# Patient Record
Sex: Female | Born: 1960 | Race: White | Hispanic: No | Marital: Married | Smoking: Never smoker
Health system: Southern US, Community
[De-identification: ages and names within clinical notes are randomized; demographics above are authoritative.]

## PROBLEM LIST (undated history)

## (undated) DIAGNOSIS — H8109 Meniere's disease, unspecified ear: Secondary | ICD-10-CM

## (undated) DIAGNOSIS — E7800 Pure hypercholesterolemia, unspecified: Secondary | ICD-10-CM

## (undated) DIAGNOSIS — G250 Essential tremor: Secondary | ICD-10-CM

## (undated) DIAGNOSIS — M797 Fibromyalgia: Secondary | ICD-10-CM

## (undated) HISTORY — PX: TYMPANOSTOMY TUBE PLACEMENT: SHX32

## (undated) HISTORY — PX: TONSILLECTOMY: SUR1361

---

## 2008-05-25 ENCOUNTER — Emergency Department (HOSPITAL_BASED_OUTPATIENT_CLINIC_OR_DEPARTMENT_OTHER): Admission: EM | Admit: 2008-05-25 | Discharge: 2008-05-25 | Payer: Self-pay | Admitting: Emergency Medicine

## 2008-05-26 ENCOUNTER — Inpatient Hospital Stay (HOSPITAL_COMMUNITY): Admission: AD | Admit: 2008-05-26 | Discharge: 2008-05-26 | Payer: Self-pay | Admitting: Obstetrics & Gynecology

## 2008-09-23 ENCOUNTER — Emergency Department (HOSPITAL_BASED_OUTPATIENT_CLINIC_OR_DEPARTMENT_OTHER): Admission: EM | Admit: 2008-09-23 | Discharge: 2008-09-23 | Payer: Self-pay | Admitting: Emergency Medicine

## 2008-09-23 ENCOUNTER — Ambulatory Visit: Payer: Self-pay | Admitting: Diagnostic Radiology

## 2008-09-29 ENCOUNTER — Ambulatory Visit: Payer: Self-pay | Admitting: Radiology

## 2008-09-29 ENCOUNTER — Ambulatory Visit (HOSPITAL_BASED_OUTPATIENT_CLINIC_OR_DEPARTMENT_OTHER): Admission: RE | Admit: 2008-09-29 | Discharge: 2008-09-29 | Payer: Self-pay | Admitting: Emergency Medicine

## 2009-10-01 HISTORY — PX: COLONOSCOPY: SHX174

## 2011-07-06 LAB — BASIC METABOLIC PANEL WITH GFR
CO2: 25 meq/L (ref 19–32)
Calcium: 9.5 mg/dL (ref 8.4–10.5)
Creatinine, Ser: 0.9 mg/dL (ref 0.4–1.2)
GFR calc Af Amer: >60 mL/min (ref >60)
GFR calc non Af Amer: >60 mL/min (ref >60)
Glucose, Bld: 102 mg/dL — ABNORMAL HIGH (ref 70–99)
Sodium: 140 meq/L (ref 135–145)

## 2011-07-06 LAB — URINALYSIS, ROUTINE W REFLEX MICROSCOPIC
Bilirubin Urine: NEGATIVE
Glucose, UA: NEGATIVE mg/dL
Hgb urine dipstick: NEGATIVE
Ketones, ur: NEGATIVE mg/dL
Nitrite: NEGATIVE
Protein, ur: NEGATIVE mg/dL
Specific Gravity, Urine: 1.01 (ref 1.005–1.030)
Urobilinogen, UA: 0.2 mg/dL (ref 0.0–1.0)
pH: 5 (ref 5.0–8.0)

## 2011-07-06 LAB — DIFFERENTIAL
Basophils Absolute: 0.1 K/uL (ref 0.0–0.1)
Basophils Relative: 1 % (ref 0–1)
Eosinophils Absolute: 0.2 10*3/uL (ref 0.0–0.7)
Eosinophils Relative: 3 % (ref 0–5)
Lymphocytes Relative: 39 % (ref 12–46)
Lymphs Abs: 2.4 10*3/uL (ref 0.7–4.0)
Monocytes Absolute: 0.5 10*3/uL (ref 0.1–1.0)
Monocytes Relative: 8 % (ref 3–12)
Neutro Abs: 2.9 K/uL (ref 1.7–7.7)
Neutrophils Relative %: 48 % (ref 43–77)

## 2011-07-06 LAB — CBC
HCT: 43.3 % (ref 36.0–46.0)
Hemoglobin: 14.4 g/dL (ref 12.0–15.0)
MCHC: 33.1 g/dL (ref 30.0–36.0)
MCV: 86.5 fL (ref 78.0–100.0)
Platelets: 270 K/uL (ref 150–400)
RBC: 5.01 MIL/uL (ref 3.87–5.11)
RDW: 12.5 % (ref 11.5–15.5)
WBC: 6.1 10*3/uL (ref 4.0–10.5)

## 2011-07-06 LAB — PREGNANCY, URINE: Preg Test, Ur: NEGATIVE

## 2011-07-06 LAB — BASIC METABOLIC PANEL
BUN: 9 mg/dL (ref 6–23)
Chloride: 104 mEq/L (ref 96–112)
Potassium: 4.3 mEq/L (ref 3.5–5.1)

## 2011-07-06 LAB — POCT CARDIAC MARKERS
CKMB, poc: <1 ng/mL — ABNORMAL LOW (ref 1.0–8.0)
Myoglobin, poc: 62 ng/mL (ref 12–200)
Troponin i, poc: <0.05 ng/mL (ref 0.00–0.09)

## 2011-07-06 LAB — GLUCOSE, CAPILLARY: Glucose-Capillary: 113 mg/dL — ABNORMAL HIGH (ref 70–99)

## 2015-05-16 ENCOUNTER — Encounter (HOSPITAL_BASED_OUTPATIENT_CLINIC_OR_DEPARTMENT_OTHER): Payer: Self-pay | Admitting: Emergency Medicine

## 2015-05-16 ENCOUNTER — Emergency Department (HOSPITAL_BASED_OUTPATIENT_CLINIC_OR_DEPARTMENT_OTHER): Payer: Medicare Other

## 2015-05-16 ENCOUNTER — Observation Stay (HOSPITAL_BASED_OUTPATIENT_CLINIC_OR_DEPARTMENT_OTHER)
Admission: EM | Admit: 2015-05-16 | Discharge: 2015-05-17 | Disposition: A | Discharge status: Home / Self Care | Payer: Medicare Other | Attending: Internal Medicine | Admitting: Internal Medicine

## 2015-05-16 DIAGNOSIS — E669 Obesity, unspecified: Secondary | ICD-10-CM | POA: Insufficient documentation

## 2015-05-16 DIAGNOSIS — Z6835 Body mass index (BMI) 35.0-35.9, adult: Secondary | ICD-10-CM | POA: Insufficient documentation

## 2015-05-16 DIAGNOSIS — R0789 Other chest pain: Secondary | ICD-10-CM | POA: Diagnosis not present

## 2015-05-16 DIAGNOSIS — I200 Unstable angina: Secondary | ICD-10-CM | POA: Diagnosis not present

## 2015-05-16 DIAGNOSIS — E7800 Pure hypercholesterolemia, unspecified: Secondary | ICD-10-CM | POA: Diagnosis present

## 2015-05-16 DIAGNOSIS — H8109 Meniere's disease, unspecified ear: Secondary | ICD-10-CM | POA: Diagnosis not present

## 2015-05-16 DIAGNOSIS — Z8249 Family history of ischemic heart disease and other diseases of the circulatory system: Secondary | ICD-10-CM | POA: Insufficient documentation

## 2015-05-16 DIAGNOSIS — E785 Hyperlipidemia, unspecified: Secondary | ICD-10-CM | POA: Insufficient documentation

## 2015-05-16 DIAGNOSIS — R079 Chest pain, unspecified: Secondary | ICD-10-CM | POA: Diagnosis present

## 2015-05-16 DIAGNOSIS — Z7982 Long term (current) use of aspirin: Secondary | ICD-10-CM | POA: Insufficient documentation

## 2015-05-16 DIAGNOSIS — Z79899 Other long term (current) drug therapy: Secondary | ICD-10-CM | POA: Insufficient documentation

## 2015-05-16 DIAGNOSIS — M797 Fibromyalgia: Secondary | ICD-10-CM | POA: Diagnosis present

## 2015-05-16 HISTORY — DX: Fibromyalgia: M79.7

## 2015-05-16 HISTORY — DX: Essential tremor: G25.0

## 2015-05-16 HISTORY — DX: Pure hypercholesterolemia, unspecified: E78.00

## 2015-05-16 HISTORY — DX: Meniere's disease, unspecified ear: H81.09

## 2015-05-16 LAB — BASIC METABOLIC PANEL
ANION GAP: 8 (ref 5–15)
Anion gap: 9 (ref 5–15)
BUN: 11 mg/dL (ref 6–20)
BUN: 10 mg/dL (ref 6–20)
CALCIUM: 8.8 mg/dL — AB (ref 8.9–10.3)
CHLORIDE: 105 mmol/L (ref 101–111)
CO2: 25 mmol/L (ref 22–32)
CO2: 24 mmol/L (ref 22–32)
CREATININE: 0.93 mg/dL (ref 0.44–1.00)
Calcium: 9 mg/dL (ref 8.9–10.3)
Chloride: 107 mmol/L (ref 101–111)
Creatinine, Ser: 0.92 mg/dL (ref 0.44–1.00)
GFR calc Af Amer: >60 mL/min (ref >60)
GFR calc Af Amer: >60 mL/min (ref >60)
GFR calc non Af Amer: >60 mL/min (ref >60)
GLUCOSE: 117 mg/dL — AB (ref 65–99)
Glucose, Bld: 121 mg/dL — ABNORMAL HIGH (ref 65–99)
POTASSIUM: 3.9 mmol/L (ref 3.5–5.1)
Potassium: 3.6 mmol/L (ref 3.5–5.1)
SODIUM: 138 mmol/L (ref 135–145)
Sodium: 140 mmol/L (ref 135–145)

## 2015-05-16 LAB — CBC WITH DIFFERENTIAL/PLATELET
BASOS ABS: 0.1 10*3/uL (ref 0.0–0.1)
Basophils Relative: 1 % (ref 0–1)
EOS PCT: 3 % (ref 0–5)
Eosinophils Absolute: 0.2 10*3/uL (ref 0.0–0.7)
HEMATOCRIT: 41.7 % (ref 36.0–46.0)
Hemoglobin: 13.5 g/dL (ref 12.0–15.0)
LYMPHS ABS: 2.4 10*3/uL (ref 0.7–4.0)
LYMPHS PCT: 49 % — AB (ref 12–46)
MCH: 29.2 pg (ref 26.0–34.0)
MCHC: 32.4 g/dL (ref 30.0–36.0)
MCV: 90.3 fL (ref 78.0–100.0)
MONO ABS: 0.5 10*3/uL (ref 0.1–1.0)
Monocytes Relative: 10 % (ref 3–12)
NEUTROS ABS: 1.9 10*3/uL (ref 1.7–7.7)
Neutrophils Relative %: 37 % — ABNORMAL LOW (ref 43–77)
PLATELETS: 215 10*3/uL (ref 150–400)
RBC: 4.62 MIL/uL (ref 3.87–5.11)
RDW: 13.3 % (ref 11.5–15.5)
WBC: 5 10*3/uL (ref 4.0–10.5)

## 2015-05-16 LAB — HEPATIC FUNCTION PANEL
ALBUMIN: 3.7 g/dL (ref 3.5–5.0)
ALT: 25 U/L (ref 14–54)
AST: 22 U/L (ref 15–41)
Alkaline Phosphatase: 94 U/L (ref 38–126)
Bilirubin, Direct: <0.1 mg/dL — ABNORMAL LOW (ref 0.1–0.5)
TOTAL PROTEIN: 7 g/dL (ref 6.5–8.1)
Total Bilirubin: 0.3 mg/dL (ref 0.3–1.2)

## 2015-05-16 LAB — TSH: TSH: 0.775 u[IU]/mL (ref 0.350–4.500)

## 2015-05-16 LAB — TROPONIN I
Troponin I: <0.03 ng/mL (ref <0.031)
Troponin I: <0.03 ng/mL (ref <0.031)

## 2015-05-16 LAB — LIPASE, BLOOD: LIPASE: 21 U/L — AB (ref 22–51)

## 2015-05-16 LAB — T4, FREE: FREE T4: 0.84 ng/dL (ref 0.61–1.12)

## 2015-05-16 MED ORDER — GI COCKTAIL ~~LOC~~
30.0000 mL | Freq: Four times a day (QID) | ORAL | Status: DC | PRN
  Filled 2015-05-16: qty 30

## 2015-05-16 MED ORDER — SODIUM CHLORIDE 0.9 % IV SOLN: 250.0000 mL | INTRAVENOUS | Status: DC | PRN

## 2015-05-16 MED ORDER — PANTOPRAZOLE SODIUM 40 MG PO TBEC
40.0000 mg | DELAYED_RELEASE_TABLET | Freq: Every day | ORAL | Status: DC
  Administered 2015-05-17: 40 mg via ORAL
  Filled 2015-05-16: qty 1

## 2015-05-16 MED ORDER — DIAZEPAM 5 MG PO TABS
2.5000 mg | ORAL_TABLET | Freq: Two times a day (BID) | ORAL | Status: DC
  Administered 2015-05-17 (×2): 2.5 mg via ORAL
  Filled 2015-05-16 (×2): qty 1

## 2015-05-16 MED ORDER — AMANTADINE HCL 100 MG PO CAPS
100.0000 mg | ORAL_CAPSULE | Freq: Two times a day (BID) | ORAL | Status: DC
  Administered 2015-05-16 – 2015-05-17 (×3): 100 mg via ORAL
  Filled 2015-05-16 (×4): qty 1

## 2015-05-16 MED ORDER — DIAZEPAM 5 MG PO TABS
5.0000 mg | ORAL_TABLET | Freq: Two times a day (BID) | ORAL | Status: DC
  Administered 2015-05-16: 2.5 mg via ORAL
  Filled 2015-05-16: qty 1

## 2015-05-16 MED ORDER — AMANTADINE HCL 100 MG PO CAPS
100.0000 mg | ORAL_CAPSULE | Freq: Two times a day (BID) | ORAL | Status: DC
  Filled 2015-05-16: qty 1

## 2015-05-16 MED ORDER — ONDANSETRON HCL 4 MG/2ML IJ SOLN: 4.0000 mg | Freq: Four times a day (QID) | INTRAMUSCULAR | Status: DC | PRN

## 2015-05-16 MED ORDER — ASPIRIN 81 MG PO TABS: 81.0000 mg | ORAL_TABLET | Freq: Every day | ORAL | Status: DC

## 2015-05-16 MED ORDER — SODIUM CHLORIDE 0.9 % WEIGHT BASED INFUSION
3.0000 mL/kg/h | INTRAVENOUS | Status: DC
  Administered 2015-05-17: 3 mL/kg/h via INTRAVENOUS

## 2015-05-16 MED ORDER — ACETAMINOPHEN 325 MG PO TABS: 650.0000 mg | ORAL_TABLET | ORAL | Status: DC | PRN

## 2015-05-16 MED ORDER — DIAZEPAM 5 MG PO TABS: 5.0000 mg | ORAL_TABLET | Freq: Two times a day (BID) | ORAL | Status: DC

## 2015-05-16 MED ORDER — ENOXAPARIN SODIUM 40 MG/0.4ML ~~LOC~~ SOLN
40.0000 mg | SUBCUTANEOUS | Status: DC
  Administered 2015-05-16: 40 mg via SUBCUTANEOUS
  Filled 2015-05-16 (×2): qty 0.4

## 2015-05-16 MED ORDER — SODIUM CHLORIDE 0.9 % WEIGHT BASED INFUSION: 1.0000 mL/kg/h | INTRAVENOUS | Status: DC

## 2015-05-16 MED ORDER — PROMETHAZINE HCL 25 MG PO TABS: 25.0000 mg | ORAL_TABLET | Freq: Four times a day (QID) | ORAL | Status: DC | PRN

## 2015-05-16 MED ORDER — ASPIRIN 81 MG PO CHEW
81.0000 mg | CHEWABLE_TABLET | ORAL | Status: AC
  Administered 2015-05-17: 81 mg via ORAL
  Filled 2015-05-16: qty 1

## 2015-05-16 MED ORDER — SODIUM CHLORIDE 0.9 % IJ SOLN
3.0000 mL | INTRAMUSCULAR | Status: DC | PRN
Start: 2015-05-16 — End: 2015-05-17
  Administered 2015-05-17: 3 mL via INTRAVENOUS
  Filled 2015-05-16: qty 3

## 2015-05-16 MED ORDER — ROSUVASTATIN CALCIUM 5 MG PO TABS
5.0000 mg | ORAL_TABLET | Freq: Every day | ORAL | Status: DC
  Administered 2015-05-16 – 2015-05-17 (×2): 5 mg via ORAL
  Filled 2015-05-16 (×2): qty 1

## 2015-05-16 MED ORDER — NITROGLYCERIN 0.4 MG SL SUBL: 0.4000 mg | SUBLINGUAL_TABLET | SUBLINGUAL | Status: DC | PRN

## 2015-05-16 MED ORDER — VITAMIN B-12 1000 MCG PO TABS
1000.0000 ug | ORAL_TABLET | Freq: Every day | ORAL | Status: DC
  Administered 2015-05-17: 1000 ug via ORAL
  Filled 2015-05-16 (×2): qty 1

## 2015-05-16 MED ORDER — SODIUM CHLORIDE 0.9 % IJ SOLN
3.0000 mL | Freq: Two times a day (BID) | INTRAMUSCULAR | Status: DC
  Administered 2015-05-17: 3 mL via INTRAVENOUS

## 2015-05-16 MED ORDER — ASPIRIN EC 81 MG PO TBEC
81.0000 mg | DELAYED_RELEASE_TABLET | Freq: Every day | ORAL | Status: DC
  Filled 2015-05-16: qty 1

## 2015-05-16 MED ORDER — MORPHINE SULFATE (PF) 2 MG/ML IV SOLN: 2.0000 mg | INTRAVENOUS | Status: DC | PRN

## 2015-05-16 NOTE — H&P (Signed)
[facility] History and Physical                                                                                    Beverly Hernandez, is a 54 y.o. female  MRN: [id number]   DOB - 1961/03/29  Admit Date - 05/16/2015  Outpatient Primary MD for the patient is [facility]. She was transferred from [facility]. Cardiologist:  Armando Gang at [facility]  Referring Physician:  Dr. Doug Sou  Chief Complaint:   Chief Complaint  Patient presents with  . Chest Pain     HPI  Beverly Hernandez  is a 54 y.o. female, with Mnire's disease, fibromyalgia, and hyperlipidemia who presented to the [facility] [place] with chest pain the patient reports that approximately 4:20 AM this morning she awoke from sleep with a heavy burning sensation in the center of her chest. It radiated to her back. It did not radiate to her jaw or down her arms. She did not experience diaphoresis or vomiting. She normally experiences nausea each morning when she wakes to take her medications for Mnire's disease. Her nausea was no worse than usual this morning. She has had a similar sensation before but nothing so intense. She said this was a very heavy pressure on her chest that lasted about 15 minutes and subsided with nitroglycerin. The patient sought medical attention as she has a very strong family history. Both her mother and father had their first heart attacks in their late 71s. She reports having a stress test approximately 3 years ago. There was some positive finding, but she has no cardiac stent or cardiac medications other than a 21 mg aspirin. We will need to obtain records from her cardiologist.  The patient reports that she has had acid reflux in the past and it did not feel anything like this. She still has her gallbladder. She does not drink alcohol or use tobacco or recreational drugs. She did not eat dinner last night. The last thing she ate was [employer] chicken at approximately 1 PM  yesterday afternoon.  Review of Systems   In addition to the HPI above, she denies any headache, sore throat, recent illness, congestion, decreased exercise tolerance, orthopnea, PND, abdominal pain, anorexia, vomiting, changes in bowel habits, dysuria. No Fever-chills, No Headache, No changes with Vision or hearing, No problems swallowing food or Liquids, + Bowel movements are never regular as she experiences both diarrhea and constipation No Blood in stool or Urine, No dysuria, No new skin rashes or bruises, No new joints pains-aches,  No new weakness, tingling, numbness in any extremity, No recent weight gain or loss, A full 10 point Review of Systems was done, except as stated above, all other Review of Systems were negative.  Past Medical History  Past Medical History  Diagnosis Date  . Benign essential tremor   . Meniere's disease   . Fibromyalgia   . Hypercholesterolemia     Past Surgical History  Procedure Laterality Date  . Tonsillectomy    . Tympanostomy tube placement      Social History Social History  Substance Use Topics  . Smoking status: Never  Smoker   . Smokeless tobacco: Not on file  . Alcohol Use: No   lives at home with her husband. Is independent with ADLs.  Family History Father has a pacemaker history of CVA and history of heart attack in his late 52s. Mother also has a history of having her first heart attack by her late 36s.  Prior to Admission medications   Medication Sig Start Date End Date Taking? Authorizing Provider  amantadine (SYMMETREL) 100 MG capsule Take 100 mg by mouth 2 (two) times daily.   Yes Historical Provider, MD  aspirin 81 MG tablet Take 81 mg by mouth daily.   Yes Historical Provider, MD  diazepam (VALIUM) 5 MG tablet Take 5 mg by mouth 2 (two) times daily.   Yes Historical Provider, MD  nitroGLYCERIN (NITROSTAT) 0.4 MG SL tablet Place 0.4 mg under the tongue every 5 (five) minutes as needed for chest pain.   Yes Historical  Provider, MD  promethazine (PHENERGAN) 25 MG tablet Take 25 mg by mouth every 6 (six) hours as needed for nausea or vomiting.   Yes Historical Provider, MD  rosuvastatin (CRESTOR) 5 MG tablet Take 5 mg by mouth daily.   Yes Historical Provider, MD  vitamin B-12 (CYANOCOBALAMIN) 1000 MCG tablet Take 1,000 mcg by mouth daily.   Yes Historical Provider, MD    Allergies  Allergen Reactions  . Codeine   . Sulfa Antibiotics     Physical Exam  Vitals  Blood pressure 112/82, pulse 79, temperature 97.8 F (36.6 C), temperature source Oral, resp. rate 19, height 5\' 7"  (1.702 m), weight 102.059 kg (225 lb), SpO2 98 %.   General:  Obese, pleasant, Caucasian female lying in bed in NAD, has not bedside  Psych:  Normal affect and insight, Not Suicidal or Homicidal, Awake Alert, Oriented X 3.  Neuro:   No F.N deficits, ALL C.Nerves Intact, Strength 5/5 all 4 extremities, Sensation intact all 4 extremities.  ENT:  Ears and Eyes appear Normal, Conjunctivae clear, PER. Moist oral mucosa without erythema or exudates.  Neck:  Supple, No lymphadenopathy appreciated  Respiratory:  Symmetrical chest wall movement, Good air movement bilaterally, CTAB.  Cardiac:  RRR, No Murmurs,  no JVD.    Abdomen:  Positive bowel sounds, Soft, Non tender, Non distended,  No masses appreciated  Skin:  No Cyanosis, Normal Skin Turgor, No Skin Rash or Bruise.  Extremities:  Able to move all 4. 5/5 strength in each,  no effusions. She has 1-2+ bilateral lower extremity edema. She states this is chronic  Data Review  CBC  Recent Labs Lab 05/16/15 0537  WBC 5.0  HGB 13.5  HCT 41.7  PLT 215  MCV 90.3  MCH 29.2  MCHC 32.4  RDW 13.3  LYMPHSABS 2.4  MONOABS 0.5  EOSABS 0.2  BASOSABS 0.1    Chemistries   Recent Labs Lab 05/16/15 0537  NA 140  K 3.9  CL 107  CO2 25  GLUCOSE 117*  BUN 11  CREATININE 0.92  CALCIUM 9.0    Cardiac Enzymes  Recent Labs Lab 05/16/15 0537 05/16/15 0825  05/16/15 1140  TROPONINI <0.03 <0.03 <0.03    Invalid input(s): POCBNP  Urinalysis    Component Value Date/Time   COLORURINE YELLOW 09/23/2008 1221   APPEARANCEUR CLEAR 09/23/2008 1221   LABSPEC 1.010 09/23/2008 1221   PHURINE 5.0 09/23/2008 1221   GLUCOSEU NEGATIVE 09/23/2008 1221   HGBUR NEGATIVE 09/23/2008 1221   BILIRUBINUR NEGATIVE 09/23/2008 1221   KETONESUR NEGATIVE 09/23/2008  1221   PROTEINUR NEGATIVE 09/23/2008 1221   UROBILINOGEN 0.2 09/23/2008 1221   NITRITE NEGATIVE 09/23/2008 1221   LEUKOCYTESUR  09/23/2008 1221    NEGATIVE MICROSCOPIC NOT DONE ON URINES WITH NEGATIVE PROTEIN, BLOOD, LEUKOCYTES, NITRITE, OR GLUCOSE <1000 mg/dL.    Imaging results:   Dg Chest Port 1 View  05/16/2015   CLINICAL DATA:  Chest pain radiating to the back  EXAM: PORTABLE CHEST - 1 VIEW  COMPARISON:  09/23/2008  FINDINGS: Normal heart size and mediastinal contours. No acute infiltrate or edema. No effusion or pneumothorax. No acute osseous findings.  IMPRESSION: Negative portable chest.   Electronically Signed   By: Marnee Spring M.D.   On: 05/16/2015 06:23    My personal review of EKG: NSR, No ST changes noted.   Assessment & Plan  Active Problems:   Chest pain   Chest pressure   Fibromyalgia   Meniere's disease   Hypercholesterolemia     Atypical chest pain A burning pressure in her central chest that lasted 15 minutes. EKG is normal, chest x-ray is normal, troponin is normal. Chest pain is not reproducible on palpation or upper extremity exertion. I will place her on PPI therapy, check a lipase, LFTs and a TSH.  If her lipase or LFTs are elevated may consider an ultrasound. Admitted to telemetry for chest pain rule out. Anticipate she will discharge home tomorrow pending a negative evaluation. Patient takes an 81 mg aspirin daily.  Mnire's disease  Continue symptomatic treatment with Valium and Phenergan as she takes at home  Hyperlipidemia Continue  Crestor   Consultants Called:  None  Family Communication:   Husband bedside  Code Status:  Full code  Condition:  Stable  Potential Disposition: Anticipate discharge to home on 8/16 if workup is negative.  Time spent in minutes : [address]  PA-C on 05/16/2015 at 2:42 PM Between 7am to 7pm - Pager - [phone] After 7pm go to [email or web address] - password [email or web address] And look for the night coverage person covering me after hours  [facility]

## 2015-05-16 NOTE — ED Provider Notes (Signed)
CSN: [id number]     Arrival date & time 05/16/15  0508 History   First MD Initiated Contact with Patient 05/16/15 [phone]     Chief Complaint  Patient presents with  . Chest Pain     (Consider location/radiation/quality/duration/timing/severity/associated sxs/prior Treatment) HPI  This is a 54 year old female who awakened from sleep about 1 hour prior to arrival with chest pain. The chest pain was located in the middle of her chest and radiated to her back. She describes it as feeling like heartburn but more severe. She has never had a pain like this before. She took one sublingual nitroglycerin with relief of pain. She states she was prescribed a nitroglycerin 2 years ago because of a family history of heart disease. She is not known to have coronary artery disease herself. The nitroglycerin is noted to be expired.  The chest pain was associated by equivocal shortness of breath but no diaphoresis. She has chronic nausea due to her Mnire's which was not worse than usual this morning. She is pain-free at the present time.  Past Medical History  Diagnosis Date  . Benign essential tremor   . Meniere's disease   . Fibromyalgia    Past Surgical History  Procedure Laterality Date  . Tonsillectomy    . Tympanostomy tube placement     No family history on file. Social History  Substance Use Topics  . Smoking status: Never Smoker   . Smokeless tobacco: Not on file  . Alcohol Use: No   OB History    No data available     Review of Systems  All other systems reviewed and are negative.   Allergies  Codeine and Sulfa antibiotics  Home Medications   Prior to Admission medications   Medication Sig Start Date End Date Taking? Authorizing Provider  amantadine (SYMMETREL) 100 MG capsule Take 100 mg by mouth 2 (two) times daily.   Yes Historical Provider, MD  aspirin 81 MG tablet Take 81 mg by mouth daily.   Yes Historical Provider, MD  diazepam (VALIUM) 5 MG tablet Take 5 mg by mouth 2  (two) times daily.   Yes Historical Provider, MD  nitroGLYCERIN (NITROSTAT) 0.4 MG SL tablet Place 0.4 mg under the tongue every 5 (five) minutes as needed for chest pain.   Yes Historical Provider, MD  promethazine (PHENERGAN) 25 MG tablet Take 25 mg by mouth every 6 (six) hours as needed for nausea or vomiting.   Yes Historical Provider, MD  rosuvastatin (CRESTOR) 5 MG tablet Take 5 mg by mouth daily.   Yes Historical Provider, MD   BP 119/74 mmHg  Pulse 79  Temp(Src) 98.6 F (37 C) (Oral)  Resp 20  Ht 5\' 7"  (1.702 m)  Wt 225 lb (102.059 kg)  BMI 35.23 kg/m2  SpO2 100%   Physical Exam  General: Well-developed, well-nourished female in no acute distress; appearance consistent with age of record HENT: normocephalic; atraumatic Eyes: pupils equal, round and reactive to light; extraocular muscles intact Neck: supple Heart: regular rate and rhythm; no murmurs, rubs or gallops Lungs: clear to auscultation bilaterally Abdomen: soft; nondistended; nontender; no masses or hepatosplenomegaly; bowel sounds present Extremities: No deformity; full range of motion; pulses normal Neurologic: Awake, alert and oriented; motor function intact in all extremities and symmetric; no facial droop Skin: Warm and dry Psychiatric: Normal mood and affect    ED Course  Procedures (including critical care time)   EKG Interpretation   Date/Time:  Monday May 16 2015 05:17:36 EDT  Ventricular Rate:  74 PR Interval:  152 QRS Duration: 82 QT Interval:  382 QTC Calculation: 424 R Axis:   41 Text Interpretation:  Normal sinus rhythm Normal ECG No significant change  was found Confirmed by Read Drivers  MD, Jonny Ruiz ([id number]) on 05/16/2015 5:18:39 AM      MDM  Nursing notes and vitals signs, including pulse oximetry, reviewed.  Summary of this visit's results, reviewed by myself:  Labs:  Results for orders placed or performed during the hospital encounter of 05/16/15 (from the past 24 hour(s))  CBC with  Differential/Platelet     Status: Abnormal   Collection Time: 05/16/15  5:37 AM  Result Value Ref Range   WBC 5.0 4.0 - 10.5 K/uL   RBC 4.62 3.87 - 5.11 MIL/uL   Hemoglobin 13.5 12.0 - 15.0 g/dL   HCT 60.4 54.0 - 98.1 %   MCV 90.3 78.0 - 100.0 fL   MCH 29.2 26.0 - 34.0 pg   MCHC 32.4 30.0 - 36.0 g/dL   RDW 19.1 47.8 - 29.5 %   Platelets 215 150 - 400 K/uL   Neutrophils Relative % 37 (L) 43 - 77 %   Neutro Abs 1.9 1.7 - 7.7 K/uL   Lymphocytes Relative 49 (H) 12 - 46 %   Lymphs Abs 2.4 0.7 - 4.0 K/uL   Monocytes Relative 10 3 - 12 %   Monocytes Absolute 0.5 0.1 - 1.0 K/uL   Eosinophils Relative 3 0 - 5 %   Eosinophils Absolute 0.2 0.0 - 0.7 K/uL   Basophils Relative 1 0 - 1 %   Basophils Absolute 0.1 0.0 - 0.1 K/uL  Basic metabolic panel     Status: Abnormal   Collection Time: 05/16/15  5:37 AM  Result Value Ref Range   Sodium 140 135 - 145 mmol/L   Potassium 3.9 3.5 - 5.1 mmol/L   Chloride 107 101 - 111 mmol/L   CO2 25 22 - 32 mmol/L   Glucose, Bld 117 (H) 65 - 99 mg/dL   BUN 11 6 - 20 mg/dL   Creatinine, Ser 6.21 0.44 - 1.00 mg/dL   Calcium 9.0 8.9 - 30.8 mg/dL   GFR calc non Af Amer >60 >60 mL/min   GFR calc Af Amer >60 >60 mL/min   Anion gap 8 5 - 15  Troponin I     Status: None   Collection Time: 05/16/15  5:37 AM  Result Value Ref Range   Troponin I <0.03 <0.031 ng/mL    Imaging Studies: Dg Chest Port 1 View  05/16/2015   CLINICAL DATA:  Chest pain radiating to the back  EXAM: PORTABLE CHEST - 1 VIEW  COMPARISON:  09/23/2008  FINDINGS: Normal heart size and mediastinal contours. No acute infiltrate or edema. No effusion or pneumothorax. No acute osseous findings.  IMPRESSION: Negative portable chest.   Electronically Signed   By: Marnee Spring M.D.   On: 05/16/2015 06:23   6:29 AM Patient remains pain-free. We'll have her admitted for observation.    Paula Libra, MD 05/16/15 [phone]

## 2015-05-16 NOTE — Progress Notes (Signed)
Patient is a 54 year old female family history of coronary artery disease presented to the ED with midsternal chest pain radiating to the back that awoke her from sleep relieved with sublingual nitroglycerin. EKG normal. First set of troponin normal. Patient's physicians are at [facility] however hyper original hospital with no beds and patient requested admission to [facility]. Patient accepted to telemetry for chest pain rule out.

## 2015-05-16 NOTE — Consult Note (Signed)
Patient ID: Beverly Hernandez MRN: [id number], DOB/AGE: [birth year]   Admit date: 05/16/2015   Primary Physician: Arnette Felts, PA-C [facility]) Primary Cardiologist: New  Pt. Profile:  54 year old female with no known history of coronary disease but with multiple risk factors including hyperlipidemia and strong family history of premature CAD in multiple first-degree relatives, presenting with complaints of nocturnal chest pain awakening her from her sleep.  Problem List  Past Medical History  Diagnosis Date  . Benign essential tremor   . Meniere's disease   . Fibromyalgia   . Hypercholesterolemia     Past Surgical History  Procedure Laterality Date  . Tonsillectomy    . Tympanostomy tube placement       Allergies  Allergies  Allergen Reactions  . Codeine   . Sulfa Antibiotics     HPI  54 y/o female with no past cardiac history but multiple risk factors including strong family history of premature CAD in fist degree relatives and HLD, presenting with complaint of chest pain. She notes both her mother and father suffered myocardial infarctions while in their 30s. Her older brother also suffered myocardial infarction/cardiac arrest while in his 98s. She is on statin therapy for her HLD, which is followed by her PCP. She denies any personal history of diabetes, hypertension or tobacco abuse. She reports that she did undergo nuclear stress testing at [facility] 5 years that was "ok". However she was told that she had a thickened heart. She believes this was discovered by an echocardiogram. She reports that her PCP wanted her to undergo further testing however she failed to follow-up at that time. She has never had a heart catheterization. Her other medical problems include Mnire's disease and fibromyalgia.  She reports that she was in her usual state of health until the early morning hours. She was awoken from her sleep at 4 AM with substernal chest  pressure/burning. The discomfort radiated to her back. She notes mild dyspnea but denies any diaphoresis, nausea, vomiting. She reports that she took a subungual nitroglycerin that she had at home and had immediate relief. She denies any further recurrence since her initial episode this morning. She denies any prior history of acid reflux. She states that her last meal was 1 PM yesterday afternoon. Given the nature of her symptoms and strong family history, she decided to go to an urgent care earlier today. She was then referred to the emergency department for further evaluation. She was admitted by internal medicine. As mentioned above, she has had no further chest pain. Cardiac enzymes are negative x 3. CXR is unremarkable. EKG shows NSR w/o ischemia.   Home Medications  Prior to Admission medications   Medication Sig Start Date End Date Taking? Authorizing Provider  amantadine (SYMMETREL) 100 MG capsule Take 100 mg by mouth 2 (two) times daily.   Yes Historical Provider, MD  aspirin 81 MG tablet Take 81 mg by mouth daily.   Yes Historical Provider, MD  diazepam (VALIUM) 5 MG tablet Take 5 mg by mouth 2 (two) times daily.   Yes Historical Provider, MD  nitroGLYCERIN (NITROSTAT) 0.4 MG SL tablet Place 0.4 mg under the tongue every 5 (five) minutes as needed for chest pain.   Yes Historical Provider, MD  promethazine (PHENERGAN) 25 MG tablet Take 25 mg by mouth every 6 (six) hours as needed for nausea or vomiting.   Yes Historical Provider, MD  rosuvastatin (CRESTOR) 5 MG tablet Take 5 mg by mouth daily.  Yes Historical Provider, MD  vitamin B-12 (CYANOCOBALAMIN) 1000 MCG tablet Take 1,000 mcg by mouth daily.   Yes Historical Provider, MD    Family History  Family History  Problem Relation Age of Onset  . Coronary artery disease Mother 89    MI  . Coronary artery disease Father 30    MI  . Coronary artery disease Brother 50    MI  . Sudden Cardiac Death Brother 1    Social  History  Social History   Social History  . Marital Status: Married    Spouse Name: N/A  . Number of Children: N/A  . Years of Education: N/A   Occupational History  . Not on file.   Social History Main Topics  . Smoking status: Never Smoker   . Smokeless tobacco: Not on file  . Alcohol Use: No  . Drug Use: No  . Sexual Activity: Not on file   Other Topics Concern  . Not on file   Social History Narrative  . No narrative on file     Review of Systems General:  No chills, fever, night sweats or weight changes.  Cardiovascular:  No chest pain, dyspnea on exertion, edema, orthopnea, palpitations, paroxysmal nocturnal dyspnea. Dermatological: No rash, lesions/masses Respiratory: No cough, dyspnea Urologic: No hematuria, dysuria Abdominal:   No nausea, vomiting, diarrhea, bright red blood per rectum, melena, or hematemesis Neurologic:  No visual changes, wkns, changes in mental status. All other systems reviewed and are otherwise negative except as noted above.  Physical Exam  Blood pressure 112/82, pulse 79, temperature 97.8 F (36.6 C), temperature source Oral, resp. rate 19, height  (1.702 m), weight 225 lb (102.059 kg), SpO2 98 %.  General: Pleasant, NAD Psych: Normal affect. Neuro: Alert and oriented X 3. Moves all extremities spontaneously. HEENT: Normal  Neck: Supple without bruits or JVD. Lungs:  Resp regular and unlabored, CTA. Heart: RRR no s3, s4, or murmurs. Abdomen: Soft, non-tender, non-distended, BS + x 4.  Extremities: No clubbing, cyanosis or edema. DP/PT/Radials 2+ and equal bilaterally.  Labs  Troponin (Point of Care Test) No results for input(s): TROPIPOC in the last 72 hours.  Recent Labs  05/16/15 0537 05/16/15 0825 05/16/15 1140  TROPONINI <0.03 <0.03 <0.03   Lab Results  Component Value Date   WBC 5.0 05/16/2015   HGB 13.5 05/16/2015   HCT 41.7 05/16/2015   MCV 90.3 05/16/2015   PLT 215 05/16/2015     Recent Labs Lab  05/16/15 0537  NA 140  K 3.9  CL 107  CO2 25  BUN 11  CREATININE 0.92  CALCIUM 9.0  GLUCOSE 117*   No results found for: CHOL, HDL, LDLCALC, TRIG No results found for: DDIMER   Radiology/Studies  Dg Chest Port 1 View  05/16/2015   CLINICAL DATA:  Chest pain radiating to the back  EXAM: PORTABLE CHEST - 1 VIEW  COMPARISON:  09/23/2008  FINDINGS: Normal heart size and mediastinal contours. No acute infiltrate or edema. No effusion or pneumothorax. No acute osseous findings.  IMPRESSION: Negative portable chest.   Electronically Signed   By: Marnee Spring M.D.   On: 05/16/2015 06:23    ECG  Sinus rhythm no ischemia  Echocardiogram      ASSESSMENT AND PLAN  Active Problems:   Chest pain   Chest pressure   Fibromyalgia   Meniere's disease   Hypercholesterolemia   Atypical chest pain   1. Unstable Angina: Symptoms concerning for unstable angina. New  onset. Substernal chest pressure/burning radiating to back. Immediately relieved with sublingual nitroglycerin. In addition, she has a very strong family history of premature CAD in multiple first-degree relatives, noting myocardial infarction in both her mother and father young in age in their 52s. She notes her brother also was suffered cardiac arrest/sudden cardiac death status post resuscitation while in his 4s. She also has a history of hyperlipidemia which is treated with statin therapy. She reports that she did have a nuclear stress study 5 years ago and was told that she had a thickened heart. Her PCP wanted her to undergo further cardiac workup at that time however she declined. Inpatient workup thus far has included nonischemic EKG and negative troponins 3. Despite this, given her symptomatology and strong family history of premature coronary artery disease, she will need to undergo further cardiac testing for risk stratification. Given her risk factors, would consider left heart catheterization for definitive assessment.  She has normal renal function. We'll defer ultimate decision to M.D. Continue ASA and statin.   2. Hyperlipidemia: Continue statin therapy with Crestor  Signed, SIMMONS, BRITTAINY, PA-C 05/16/2015, 4:43 PM  I have examined the patient and reviewed assessment and plan and discussed with patient.  Agree with above as stated.  Episode of rest pain waking her from sleep relieved by SL NTG. Given family h/o premature CAD and prior negative stress test, would plan of cath.   The patient understands that risks include but are not limited to stroke (1 in 1000), death (1 in 1000), kidney failure [usually temporary] (1 in 500), bleeding (1 in 200), allergic reaction [possibly serious] (1 in 200), and agrees to proceed.     Kieli Golladay S.

## 2015-05-16 NOTE — ED Notes (Signed)
Patient states she woke around 0430 this morning with chest pain in central chest radiating to her back. States "it might have been heartburn", and that she took nitroglycerin at home and has no pain at this time. Describes the pain as burning and aching when she had it. Patient reports she does not know why she was prescribed nitroglycerin.

## 2015-05-16 NOTE — ED Notes (Signed)
Patient called out, wants to talk to RN, RN made aware.

## 2015-05-16 NOTE — Progress Notes (Signed)
05/16/2015 1:57 PM md paged and made aware of pt. Arrival to floor. Beverly Hernandez, Beverly Hernandez

## 2015-05-16 NOTE — ED Notes (Signed)
MD at bedside. 

## 2015-05-16 NOTE — ED Provider Notes (Signed)
11:48 AM patient resting comfortably, asymptomatic  Doug Sou, MD 05/16/15 1150

## 2015-05-16 NOTE — ED Notes (Signed)
Pt remains SR on monitor. Denies any CP/sob at present. Awaiting bed assignment at [facility].

## 2015-05-17 ENCOUNTER — Encounter (HOSPITAL_COMMUNITY)
Admission: EM | Disposition: A | Discharge status: Home / Self Care | Payer: Medicare Other | Source: Home / Self Care | Attending: Internal Medicine

## 2015-05-17 ENCOUNTER — Encounter (HOSPITAL_COMMUNITY): Payer: Self-pay | Admitting: General Practice

## 2015-05-17 DIAGNOSIS — E780 Pure hypercholesterolemia: Secondary | ICD-10-CM

## 2015-05-17 DIAGNOSIS — M797 Fibromyalgia: Secondary | ICD-10-CM

## 2015-05-17 DIAGNOSIS — I209 Angina pectoris, unspecified: Secondary | ICD-10-CM

## 2015-05-17 DIAGNOSIS — R0789 Other chest pain: Principal | ICD-10-CM

## 2015-05-17 DIAGNOSIS — R079 Chest pain, unspecified: Secondary | ICD-10-CM | POA: Insufficient documentation

## 2015-05-17 HISTORY — PX: CARDIAC CATHETERIZATION: SHX172

## 2015-05-17 LAB — PROTIME-INR
INR: 1.05 (ref 0.00–1.49)
PROTHROMBIN TIME: 13.9 s (ref 11.6–15.2)

## 2015-05-17 SURGERY — LEFT HEART CATH AND CORONARY ANGIOGRAPHY

## 2015-05-17 MED ORDER — MIDAZOLAM HCL 2 MG/2ML IJ SOLN
INTRAMUSCULAR | Status: AC
  Filled 2015-05-17: qty 4

## 2015-05-17 MED ORDER — SODIUM CHLORIDE 0.9 % IV SOLN: INTRAVENOUS | Status: AC

## 2015-05-17 MED ORDER — FENTANYL CITRATE (PF) 100 MCG/2ML IJ SOLN
INTRAMUSCULAR | Status: DC | PRN
  Administered 2015-05-17: 50 ug via INTRAVENOUS

## 2015-05-17 MED ORDER — LIDOCAINE HCL (PF) 1 % IJ SOLN
INTRAMUSCULAR | Status: DC | PRN
  Administered 2015-05-17: 5 mL via SUBCUTANEOUS

## 2015-05-17 MED ORDER — LIDOCAINE HCL (PF) 1 % IJ SOLN
INTRAMUSCULAR | Status: AC
  Filled 2015-05-17: qty 30

## 2015-05-17 MED ORDER — SODIUM CHLORIDE 0.9 % IJ SOLN: 3.0000 mL | INTRAMUSCULAR | Status: DC | PRN

## 2015-05-17 MED ORDER — SODIUM CHLORIDE 0.9 % IV SOLN: 250.0000 mL | INTRAVENOUS | Status: DC | PRN

## 2015-05-17 MED ORDER — FENTANYL CITRATE (PF) 100 MCG/2ML IJ SOLN
INTRAMUSCULAR | Status: AC
  Filled 2015-05-17: qty 4

## 2015-05-17 MED ORDER — SODIUM CHLORIDE 0.9 % IV SOLN
INTRAVENOUS | Status: DC | PRN
  Administered 2015-05-17: 102 mL/h via INTRAVENOUS

## 2015-05-17 MED ORDER — NITROGLYCERIN 1 MG/10 ML FOR IR/CATH LAB
INTRA_ARTERIAL | Status: AC
  Filled 2015-05-17: qty 10

## 2015-05-17 MED ORDER — HEPARIN SODIUM (PORCINE) 1000 UNIT/ML IJ SOLN
INTRAMUSCULAR | Status: AC
  Filled 2015-05-17: qty 1

## 2015-05-17 MED ORDER — HEPARIN SODIUM (PORCINE) 1000 UNIT/ML IJ SOLN
INTRAMUSCULAR | Status: DC | PRN
  Administered 2015-05-17: 5000 [IU] via INTRAVENOUS

## 2015-05-17 MED ORDER — SODIUM CHLORIDE 0.9 % IJ SOLN: 3.0000 mL | Freq: Two times a day (BID) | INTRAMUSCULAR | Status: DC

## 2015-05-17 MED ORDER — VERAPAMIL HCL 2.5 MG/ML IV SOLN
INTRAVENOUS | Status: AC
Start: 2015-05-17 — End: 2015-05-17
  Filled 2015-05-17: qty 2

## 2015-05-17 MED ORDER — VERAPAMIL HCL 2.5 MG/ML IV SOLN
INTRAVENOUS | Status: DC | PRN
  Administered 2015-05-17: 11:00:00 via INTRA_ARTERIAL

## 2015-05-17 MED ORDER — HEPARIN (PORCINE) IN NACL 2-0.9 UNIT/ML-% IJ SOLN
INTRAMUSCULAR | Status: AC
  Filled 2015-05-17: qty 1000

## 2015-05-17 MED ORDER — IOHEXOL 350 MG/ML SOLN
INTRAVENOUS | Status: DC | PRN
  Administered 2015-05-17: 70 mL via INTRACARDIAC

## 2015-05-17 MED ORDER — MIDAZOLAM HCL 2 MG/2ML IJ SOLN
INTRAMUSCULAR | Status: DC | PRN
  Administered 2015-05-17: 1 mg via INTRAVENOUS

## 2015-05-17 SURGICAL SUPPLY — 11 items

## 2015-05-17 NOTE — Discharge Summary (Signed)
Physician Discharge Summary  Arlisha Patalano ZOX:[id number] DOB: 06/20/1961 DOA: 05/16/2015  PCP: Pcp Not In System  Admit date: 05/16/2015 Discharge date: 05/18/2015  Time spent: 45 minutes  Recommendations for Outpatient Follow-up:  1. PCP in 1 week  Discharge Diagnoses:  Active Problems:   Chest pain   Fibromyalgia   Meniere's disease   Hypercholesterolemia   Atypical chest pain   Chest pain at rest   Discharge Condition: stable  Diet recommendation: heart healthy  Filed Weights   05/16/15 0520 05/16/15 1252 05/17/15 0403  Weight: 102.059 kg (225 lb) 102.059 kg (225 lb) 102.604 kg (226 lb 3.2 oz)    History of present illness:   Chief complaint: Chest Pain   HPI: Beverly Hernandez is a 54 y.o. female, with Mnire's disease, fibromyalgia, and hyperlipidemia who presented to the [facility] [place] with chest pain that she awoke from sleep with a heavy burning sensation in the center of her chest. It radiated to her back. It did not radiate to her jaw or down her arms. She did not experience diaphoresis or vomiting. She said this was a very heavy pressure on her chest that lasted about 15 minutes and subsided with nitroglycerin. The patient sought medical attention as she has a very strong family history. Both her mother and father had their first heart attacks in their late 32s. She reports having a stress test approximately 3 years ago.         Hospital Course:  Chest pressure: Atypical with some typical features -Pain was promptly relieved after taking a dose of sublingual NTG.  -She gives strong family history of heart disease: Father had his first heart attack in the 30s and subsequently had CABG in the 7s. Troponin 3: Negative. Chest x-ray: Negative. EKG sinus rhythm without acute changes.  -seen by Cardiology in consultation, underwent L heart cath which was normal and subsequently discharged home in a stable condition. -she is advised to FU with her PCP to workup  alternate etiologies if symptoms return   Procedures:  The left ventricular systolic function is normal.  1. No angiographic evidence of CAD. 2. Normal LV systolic function 3. Non-cardiac chest pain   Consultations:  Cardiology  Discharge Exam: Filed Vitals:   05/17/15 1348  BP: 119/76  Pulse: 69  Temp:   Resp:     General: AAOx3 Cardiovascular: S1S2/RRR Respiratory: CTAB  Discharge Instructions   Discharge Instructions    Diet - low sodium heart healthy    Complete by:  As directed      Increase activity slowly    Complete by:  As directed           Discharge Medication List as of 05/17/2015  4:23 PM    CONTINUE these medications which have NOT CHANGED   Details  amantadine (SYMMETREL) 100 MG capsule Take 100 mg by mouth 2 (two) times daily., Until Discontinued, Historical Med    aspirin 81 MG tablet Take 81 mg by mouth daily., Until Discontinued, Historical Med    diazepam (VALIUM) 5 MG tablet Take 2.5 mg by mouth 2 (two) times daily. , Until Discontinued, Historical Med    nitroGLYCERIN (NITROSTAT) 0.4 MG SL tablet Place 0.4 mg under the tongue every 5 (five) minutes as needed for chest pain., Until Discontinued, Historical Med    promethazine (PHENERGAN) 25 MG tablet Take 25 mg by mouth every 6 (six) hours as needed for nausea or vomiting., Until Discontinued, Historical Med    rosuvastatin (CRESTOR) 5  MG tablet Take 5 mg by mouth daily., Until Discontinued, Historical Med    vitamin B-12 (CYANOCOBALAMIN) 1000 MCG tablet Take 1,000 mcg by mouth daily., Until Discontinued, Historical Med       Allergies  Allergen Reactions  . Codeine   . Sulfa Antibiotics    Follow-up Information    Follow up with PCP In 1 week.   Why:  Please Fu Hbaic       The results of significant diagnostics from this hospitalization (including imaging, microbiology, ancillary and laboratory) are listed below for reference.    Significant Diagnostic Studies: Dg Chest  Port 1 View  05/16/2015   CLINICAL DATA:  Chest pain radiating to the back  EXAM: PORTABLE CHEST - 1 VIEW  COMPARISON:  09/23/2008  FINDINGS: Normal heart size and mediastinal contours. No acute infiltrate or edema. No effusion or pneumothorax. No acute osseous findings.  IMPRESSION: Negative portable chest.   Electronically Signed   By: Marnee Spring M.D.   On: 05/16/2015 06:23    Microbiology: No results found for this or any previous visit (from the past 240 hour(s)).   Labs: Basic Metabolic Panel:  Recent Labs Lab 05/16/15 0537 05/16/15 2050  NA 140 138  K 3.9 3.6  CL 107 105  CO2 25 24  GLUCOSE 117* 121*  BUN 11 10  CREATININE 0.92 0.93  CALCIUM 9.0 8.8*   Liver Function Tests:  Recent Labs Lab 05/16/15 1552  AST 22  ALT 25  ALKPHOS 94  BILITOT 0.3  PROT 7.0  ALBUMIN 3.7    Recent Labs Lab 05/16/15 1552  LIPASE 21*   No results for input(s): AMMONIA in the last 168 hours. CBC:  Recent Labs Lab 05/16/15 0537  WBC 5.0  NEUTROABS 1.9  HGB 13.5  HCT 41.7  MCV 90.3  PLT 215   Cardiac Enzymes:  Recent Labs Lab 05/16/15 0537 05/16/15 0825 05/16/15 1140 05/16/15 1552  TROPONINI <0.03 <0.03 <0.03 <0.03   BNP: BNP (last 3 results) No results for input(s): BNP in the last 8760 hours.  ProBNP (last 3 results) No results for input(s): PROBNP in the last 8760 hours.  CBG: No results for input(s): GLUCAP in the last 168 hours.     SignedZannie Cove  [facility] 05/18/2015, 11:01 PM

## 2015-05-17 NOTE — Interval H&P Note (Signed)
History and Physical Interval Note:  05/17/2015 10:18 AM  Beverly Hernandez  has presented today for cardiac catheterization with the diagnosis of chest pain. The various methods of treatment have been discussed with the patient and family. After consideration of risks, benefits and other options for treatment, the patient has consented to  Procedure(s): Left Heart Cath and Coronary Angiography (N/A) as a surgical intervention .  The patient's history has been reviewed, patient examined, no change in status, stable for surgery.  I have reviewed the patient's chart and labs.  Questions were answered to the patient's satisfaction.   Cath Lab Visit (complete for each Cath Lab visit)  Clinical Evaluation Leading to the Procedure:   ACS: No.  Non-ACS:    Anginal Classification: CCS III  Anti-ischemic medical therapy: No Therapy  Non-Invasive Test Results: No non-invasive testing performed  Prior CABG: No previous CABG          MCALHANY,CHRISTOPHER

## 2015-05-17 NOTE — H&P (View-Only) (Signed)
Patient ID: Beverly Hernandez MRN: [id number], DOB/AGE: [birth year]   Admit date: 05/16/2015   Primary Physician: Arnette Felts, PA-C [facility]) Primary Cardiologist: New  Pt. Profile:  54 year old female with no known history of coronary disease but with multiple risk factors including hyperlipidemia and strong family history of premature CAD in multiple first-degree relatives, presenting with complaints of nocturnal chest pain awakening her from her sleep.  Problem List  Past Medical History  Diagnosis Date  . Benign essential tremor   . Meniere's disease   . Fibromyalgia   . Hypercholesterolemia     Past Surgical History  Procedure Laterality Date  . Tonsillectomy    . Tympanostomy tube placement       Allergies  Allergies  Allergen Reactions  . Codeine   . Sulfa Antibiotics     HPI  54 y/o female with no past cardiac history but multiple risk factors including strong family history of premature CAD in fist degree relatives and HLD, presenting with complaint of chest pain. She notes both her mother and father suffered myocardial infarctions while in their 30s. Her older brother also suffered myocardial infarction/cardiac arrest while in his 98s. She is on statin therapy for her HLD, which is followed by her PCP. She denies any personal history of diabetes, hypertension or tobacco abuse. She reports that she did undergo nuclear stress testing at [facility] 5 years that was "ok". However she was told that she had a thickened heart. She believes this was discovered by an echocardiogram. She reports that her PCP wanted her to undergo further testing however she failed to follow-up at that time. She has never had a heart catheterization. Her other medical problems include Mnire's disease and fibromyalgia.  She reports that she was in her usual state of health until the early morning hours. She was awoken from her sleep at 4 AM with substernal chest  pressure/burning. The discomfort radiated to her back. She notes mild dyspnea but denies any diaphoresis, nausea, vomiting. She reports that she took a subungual nitroglycerin that she had at home and had immediate relief. She denies any further recurrence since her initial episode this morning. She denies any prior history of acid reflux. She states that her last meal was 1 PM yesterday afternoon. Given the nature of her symptoms and strong family history, she decided to go to an urgent care earlier today. She was then referred to the emergency department for further evaluation. She was admitted by internal medicine. As mentioned above, she has had no further chest pain. Cardiac enzymes are negative x 3. CXR is unremarkable. EKG shows NSR w/o ischemia.   Home Medications  Prior to Admission medications   Medication Sig Start Date End Date Taking? Authorizing Provider  amantadine (SYMMETREL) 100 MG capsule Take 100 mg by mouth 2 (two) times daily.   Yes Historical Provider, MD  aspirin 81 MG tablet Take 81 mg by mouth daily.   Yes Historical Provider, MD  diazepam (VALIUM) 5 MG tablet Take 5 mg by mouth 2 (two) times daily.   Yes Historical Provider, MD  nitroGLYCERIN (NITROSTAT) 0.4 MG SL tablet Place 0.4 mg under the tongue every 5 (five) minutes as needed for chest pain.   Yes Historical Provider, MD  promethazine (PHENERGAN) 25 MG tablet Take 25 mg by mouth every 6 (six) hours as needed for nausea or vomiting.   Yes Historical Provider, MD  rosuvastatin (CRESTOR) 5 MG tablet Take 5 mg by mouth daily.  Yes Historical Provider, MD  vitamin B-12 (CYANOCOBALAMIN) 1000 MCG tablet Take 1,000 mcg by mouth daily.   Yes Historical Provider, MD    Family History  Family History  Problem Relation Age of Onset  . Coronary artery disease Mother 89    MI  . Coronary artery disease Father 30    MI  . Coronary artery disease Brother 50    MI  . Sudden Cardiac Death Brother 1    Social  History  Social History   Social History  . Marital Status: Married    Spouse Name: N/A  . Number of Children: N/A  . Years of Education: N/A   Occupational History  . Not on file.   Social History Main Topics  . Smoking status: Never Smoker   . Smokeless tobacco: Not on file  . Alcohol Use: No  . Drug Use: No  . Sexual Activity: Not on file   Other Topics Concern  . Not on file   Social History Narrative  . No narrative on file     Review of Systems General:  No chills, fever, night sweats or weight changes.  Cardiovascular:  No chest pain, dyspnea on exertion, edema, orthopnea, palpitations, paroxysmal nocturnal dyspnea. Dermatological: No rash, lesions/masses Respiratory: No cough, dyspnea Urologic: No hematuria, dysuria Abdominal:   No nausea, vomiting, diarrhea, bright red blood per rectum, melena, or hematemesis Neurologic:  No visual changes, wkns, changes in mental status. All other systems reviewed and are otherwise negative except as noted above.  Physical Exam  Blood pressure 112/82, pulse 79, temperature 97.8 F (36.6 C), temperature source Oral, resp. rate 19, height  (1.702 m), weight 225 lb (102.059 kg), SpO2 98 %.  General: Pleasant, NAD Psych: Normal affect. Neuro: Alert and oriented X 3. Moves all extremities spontaneously. HEENT: Normal  Neck: Supple without bruits or JVD. Lungs:  Resp regular and unlabored, CTA. Heart: RRR no s3, s4, or murmurs. Abdomen: Soft, non-tender, non-distended, BS + x 4.  Extremities: No clubbing, cyanosis or edema. DP/PT/Radials 2+ and equal bilaterally.  Labs  Troponin (Point of Care Test) No results for input(s): TROPIPOC in the last 72 hours.  Recent Labs  05/16/15 0537 05/16/15 0825 05/16/15 1140  TROPONINI <0.03 <0.03 <0.03   Lab Results  Component Value Date   WBC 5.0 05/16/2015   HGB 13.5 05/16/2015   HCT 41.7 05/16/2015   MCV 90.3 05/16/2015   PLT 215 05/16/2015     Recent Labs Lab  05/16/15 0537  NA 140  K 3.9  CL 107  CO2 25  BUN 11  CREATININE 0.92  CALCIUM 9.0  GLUCOSE 117*   No results found for: CHOL, HDL, LDLCALC, TRIG No results found for: DDIMER   Radiology/Studies  Dg Chest Port 1 View  05/16/2015   CLINICAL DATA:  Chest pain radiating to the back  EXAM: PORTABLE CHEST - 1 VIEW  COMPARISON:  09/23/2008  FINDINGS: Normal heart size and mediastinal contours. No acute infiltrate or edema. No effusion or pneumothorax. No acute osseous findings.  IMPRESSION: Negative portable chest.   Electronically Signed   By: Marnee Spring M.D.   On: 05/16/2015 06:23    ECG  Sinus rhythm no ischemia  Echocardiogram      ASSESSMENT AND PLAN  Active Problems:   Chest pain   Chest pressure   Fibromyalgia   Meniere's disease   Hypercholesterolemia   Atypical chest pain   1. Unstable Angina: Symptoms concerning for unstable angina. New  onset. Substernal chest pressure/burning radiating to back. Immediately relieved with sublingual nitroglycerin. In addition, she has a very strong family history of premature CAD in multiple first-degree relatives, noting myocardial infarction in both her mother and father young in age in their 52s. She notes her brother also was suffered cardiac arrest/sudden cardiac death status post resuscitation while in his 4s. She also has a history of hyperlipidemia which is treated with statin therapy. She reports that she did have a nuclear stress study 5 years ago and was told that she had a thickened heart. Her PCP wanted her to undergo further cardiac workup at that time however she declined. Inpatient workup thus far has included nonischemic EKG and negative troponins 3. Despite this, given her symptomatology and strong family history of premature coronary artery disease, she will need to undergo further cardiac testing for risk stratification. Given her risk factors, would consider left heart catheterization for definitive assessment.  She has normal renal function. We'll defer ultimate decision to M.D. Continue ASA and statin.   2. Hyperlipidemia: Continue statin therapy with Crestor  Signed, SIMMONS, BRITTAINY, PA-C 05/16/2015, 4:43 PM  I have examined the patient and reviewed assessment and plan and discussed with patient.  Agree with above as stated.  Episode of rest pain waking her from sleep relieved by SL NTG. Given family h/o premature CAD and prior negative stress test, would plan of cath.   The patient understands that risks include but are not limited to stroke (1 in 1000), death (1 in 1000), kidney failure [usually temporary] (1 in 500), bleeding (1 in 200), allergic reaction [possibly serious] (1 in 200), and agrees to proceed.     Emil Weigold S.

## 2015-05-17 NOTE — Progress Notes (Signed)
Patient Name: Beverly Hernandez Date of Encounter: 05/17/2015  Active Problems:   Chest pain   Chest pressure   Fibromyalgia   Meniere's disease   Hypercholesterolemia   Atypical chest pain   Primary Cardiologist: New Patient Profile: 54 yo female w/ PMH of HLD strong FH of CAD (Mother and Father had MI's while in their 57's) admitted on 05/16/15 for new onset substernal chest pain. Cardiac cath scheduled for 05/17/15.  SUBJECTIVE: No chest pain or shortness of breath overnight. Scheduled for cath at 0900 today.  OBJECTIVE Filed Vitals:   05/16/15 1130 05/16/15 1252 05/16/15 2105 05/17/15 0403  BP: 127/66 112/82 120/70 117/69  Pulse: 81 79 78 70  Temp:  97.8 F (36.6 C) 97.6 F (36.4 C) 97.6 F (36.4 C)  TempSrc:  Oral Oral Oral  Resp:  Height:   (1.702 m)    Weight:  225 lb (102.059 kg)  226 lb 3.2 oz (102.604 kg)  SpO2: 99% 98% 94% 96%   No intake or output data in the 24 hours ending 05/17/15 0911 Filed Weights   05/16/15 0520 05/16/15 1252 05/17/15 0403  Weight: 225 lb (102.059 kg) 225 lb (102.059 kg) 226 lb 3.2 oz (102.604 kg)    PHYSICAL EXAM General: Well developed, well nourished, female in no acute distress. Head: Normocephalic, atraumatic.  Neck: Supple without bruits, JVD not elevated. Lungs:  Resp regular and unlabored, CTA without wheezing or rales. Heart: RRR, S1, S2, no S3, S4, or murmur; no rub. Abdomen: Soft, non-tender, non-distended, BS + x 4.  Extremities: No clubbing, no cyanosis, no edema. Distal pulses 2+ bilaterally. Neuro: Alert and oriented X 3. Moves all extremities spontaneously. Psych: Normal affect.  LABS: CBC: Recent Labs  05/16/15 0537  WBC 5.0  NEUTROABS 1.9  HGB 13.5  HCT 41.7  MCV 90.3  PLT 215   INR: Recent Labs  05/17/15 0537  INR 1.05   Basic Metabolic Panel: Recent Labs  05/16/15 0537 05/16/15 2050  NA 140 138  K 3.9 3.6  CL 107 105  CO2 25 24  GLUCOSE 117* 121*  BUN 11 10  CREATININE  0.92 0.93  CALCIUM 9.0 8.8*   Liver Function Tests: Recent Labs  05/16/15 1552  AST 22  ALT 25  ALKPHOS 94  BILITOT 0.3  PROT 7.0  ALBUMIN 3.7   Cardiac Enzymes: Recent Labs  05/16/15 0825 05/16/15 1140 05/16/15 1552  TROPONINI <0.03 <0.03 <0.03   Thyroid Function Tests: Recent Labs  05/16/15 1552  TSH 0.775   TELE: NSR without any atopic events.      ECG: 05/16/15: Rate in 70's. Normal axis. No ST changes noted.   Current Medications:  . amantadine  100 mg Oral BID  . aspirin EC  81 mg Oral Daily  . diazepam  2.5 mg Oral BID  . enoxaparin (LOVENOX) injection  40 mg Subcutaneous Q24H  . pantoprazole  40 mg Oral Daily  . rosuvastatin  5 mg Oral Daily  . sodium chloride  3 mL Intravenous Q12H  . vitamin B-12  1,000 mcg Oral Daily   . sodium chloride 1 mL/kg/hr (05/17/15 0700)    ASSESSMENT AND PLAN:  1. Unstable Angina - EKG without ischemic changes. Cyclic troponin values have been negative. - Last cardiac workup was a nuclear stress study 5+ years ago. She is scheduled for a LHC today. The risks and benefits of the procedure have already been discussed with the patient. - Continue  ASA and statin.   2. HLD - continue statin  Otherwise, per IM.   Signed, Wandra Mannan , PA-C 9:11 AM 05/17/2015

## 2015-05-18 LAB — HEMOGLOBIN A1C
HEMOGLOBIN A1C: 6.1 % — AB (ref 4.8–5.6)
MEAN PLASMA GLUCOSE: 128 mg/dL

## 2015-05-18 MED FILL — Heparin Sodium (Porcine) 2 Unit/ML in Sodium Chloride 0.9%: INTRAMUSCULAR | Qty: 1000 | Status: AC

## 2015-11-18 IMAGING — DX DG CHEST 1V PORT
1 series · 1 of 1 positions shown · non-contrast
Comparison: 09/23/2008

CLINICAL DATA: Chest pain radiating to the back

EXAM:
PORTABLE CHEST - 1 VIEW

[chest ap]
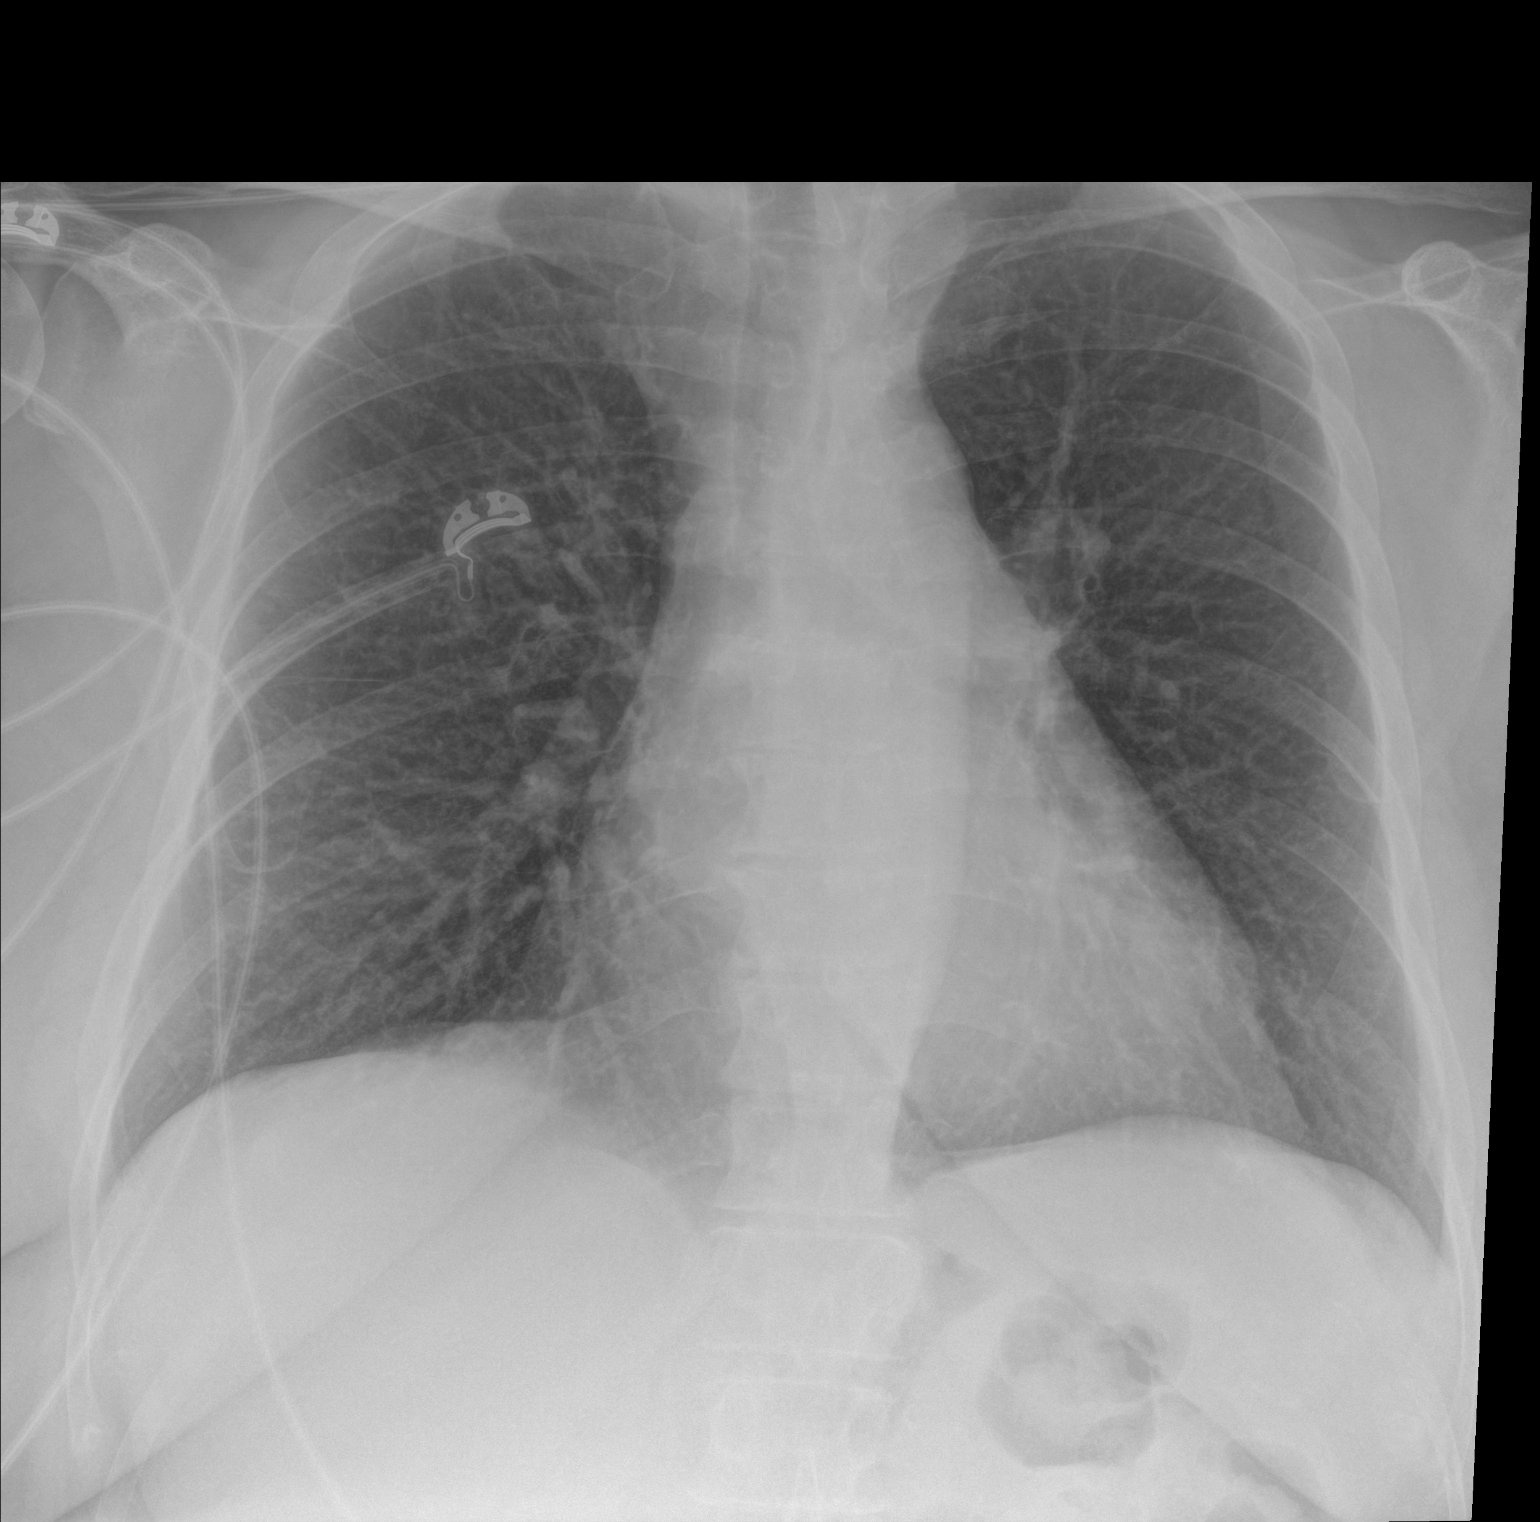

[1 of 1 positions shown; findings below may reference images not displayed]

FINDINGS: Normal heart size and mediastinal contours. No acute infiltrate or
edema. No effusion or pneumothorax. No acute osseous findings.
IMPRESSION: Negative portable chest.
